# Patient Record
Sex: Female | Born: 1990 | Race: Black or African American | Hispanic: No | Marital: Single | Smoking: Never smoker
Health system: Southern US, Community
[De-identification: ages and names within clinical notes are randomized; demographics above are authoritative.]

---

## 2012-02-28 ENCOUNTER — Emergency Department: Payer: Self-pay | Admitting: Emergency Medicine

## 2012-02-28 LAB — CBC
HGB: 13.8 g/dL (ref 12.0–16.0)
MCH: 32.7 pg (ref 26.0–34.0)
MCHC: 35.1 g/dL (ref 32.0–36.0)
MCV: 93 fL (ref 80–100)
RBC: 4.22 10*6/uL (ref 3.80–5.20)
RDW: 13.6 % (ref 11.5–14.5)
WBC: 7 10*3/uL (ref 3.6–11.0)

## 2012-02-28 LAB — URINALYSIS, COMPLETE
Bacteria: NONE SEEN
Bilirubin,UR: NEGATIVE
Ph: 5 (ref 4.5–8.0)
Protein: 100
RBC,UR: 2039 /HPF (ref 0–5)
Specific Gravity: 1.024 (ref 1.003–1.030)
Squamous Epithelial: 1
WBC UR: 27 /HPF (ref 0–5)

## 2014-06-03 NOTE — Consult Note (Signed)
24 y/o BFto ED with heavy vaginal bleeding.to have approx19 8 week twin gestational sacs; first sac elongated and low, the second higher in the uterus with area of hemorrhage aboveamount of tissue passed spontaneously after U/S, in ED     NKDA  [facility]; last given at Thanksgiving (reports neg. preg test at that time)  Surg: none  OB: 854 y/o daughter  fam hx:  N/C  Soc Hx: denies tob,alcohol,stree drugs    All systems normal axcept for Vag Bleeding/cramps   AF   VSS  NAD  A&O x4  HEENT: neg  Lungs: CTA  Heart: RRR  Back: neg CVAT   Extrem: NT  Vulva: clot noted external vag  Spec: No active VB noted; Cvx closed to sponge forcep   U/S shows no apparent residual tissue; what appears to be clot is noted thruout. No echogenic material is seen Completed SAB of twin gestation  1 -- pelvic rest  2 -- F/U with me 2 weeks  3 -- If saturating pads for greater than 2 hrs in a row, return to ED  Electronic Signatures: Margaretha GlassingEvans, Ricky L (MD)  (Signed on 17-Jan-14 06:11)  Authored  Last Updated: 17-Jan-14 06:11 by Margaretha GlassingEvans, Ricky L (MD)

## 2016-07-10 ENCOUNTER — Emergency Department
Admission: EM | Admit: 2016-07-10 | Discharge: 2016-07-10 | Disposition: A | Discharge status: Home / Self Care | Payer: Self-pay | Attending: Emergency Medicine | Admitting: Emergency Medicine

## 2016-07-10 ENCOUNTER — Encounter: Payer: Self-pay | Admitting: Emergency Medicine

## 2016-07-10 DIAGNOSIS — N3000 Acute cystitis without hematuria: Secondary | ICD-10-CM | POA: Insufficient documentation

## 2016-07-10 LAB — POCT PREGNANCY, URINE: Preg Test, Ur: NEGATIVE

## 2016-07-10 LAB — URINALYSIS, COMPLETE (UACMP) WITH MICROSCOPIC
Bacteria, UA: NONE SEEN
Bilirubin Urine: NEGATIVE
Glucose, UA: NEGATIVE mg/dL
Hgb urine dipstick: NEGATIVE
KETONES UR: NEGATIVE mg/dL
Nitrite: NEGATIVE
PH: 6 (ref 5.0–8.0)
PROTEIN: NEGATIVE mg/dL
Specific Gravity, Urine: 1.019 (ref 1.005–1.030)

## 2016-07-10 MED ORDER — CIPROFLOXACIN HCL 500 MG PO TABS: 500.0000 mg | ORAL_TABLET | Freq: Two times a day (BID) | ORAL | 0 refills | Status: AC

## 2016-07-10 NOTE — ED Provider Notes (Signed)
[facility] Emergency Department Provider Note  ____________________________________________  Time seen: Approximately 9:50 AM  I have reviewed the triage vital signs and the nursing notes.   HISTORY  Chief Complaint Hematuria and Dysuria    HPI Stephanie Yoder is a 26 y.o. female who presents to the emergency department for evaluation of dysuria and hematuria. She denies frequent UTIs, fever, and back pain. She has not taken any medication for pain.  History reviewed. No pertinent past medical history.  There are no active problems to display for this patient.   History reviewed. No pertinent surgical history.  Prior to Admission medications   Medication Sig Start Date End Date Taking? Authorizing Provider  ciprofloxacin (CIPRO) 500 MG tablet Take 1 tablet (500 mg total) by mouth 2 (two) times daily. 07/10/16   Abrie Egloff, Rulon Eisenmenger B, FNP    Allergies Sulfa antibiotics  No family history on file.  Social History Social History  Substance Use Topics  . Smoking status: Never Smoker  . Smokeless tobacco: Never Used  . Alcohol use Yes     Comment: occasionally    Review of Systems Constitutional: Negative for fever. Respiratory: Negative for shortness of breath or cough. Gastrointestinal: Negative for abdominal pain; negative for nausea , negative for vomiting. Genitourinary: Positive for dysuria , negative for vaginal discharge. Musculoskeletal: Negative for back pain. Skin: Negative for rash, lesion, or wound. ____________________________________________   PHYSICAL EXAM:  VITAL SIGNS: ED Triage Vitals  Enc Vitals Group     BP 07/10/16 0921 (!) 128/93     Pulse Rate 07/10/16 0921 75     Resp 07/10/16 0921 18     Temp 07/10/16 0921 98.2 F (36.8 C)     Temp Source 07/10/16 0921 Oral     SpO2 07/10/16 0921 100 %     Weight 07/10/16 0921 152 lb (68.9 kg)     Height 07/10/16 0921 5\' 8"  (1.727 m)     Head Circumference --      Peak Flow --       Pain Score 07/10/16 0920 6     Pain Loc --      Pain Edu? --      Excl. in GC? --     Constitutional: Alert and oriented. Well appearing and in no acute distress. Eyes: Conjunctivae are normal. PERRL. EOMI. Head: Atraumatic. Nose: No congestion/rhinnorhea. Mouth/Throat: Mucous membranes are moist. Respiratory: Normal respiratory effort.  No retractions. Gastrointestinal: Soft and nontender without rebound or guarding. Genitourinary: Pelvic exam: not indicated. Musculoskeletal: No extremity tenderness nor edema.  Neurologic:  Normal speech and language. No gross focal neurologic deficits are appreciated. Speech is normal. No gait instability. Skin:  Skin is warm, dry and intact. No rash noted. Psychiatric: Mood and affect are normal. Speech and behavior are normal.  ____________________________________________   LABS (all labs ordered are listed, but only abnormal results are displayed)  Labs Reviewed  URINALYSIS, COMPLETE (UACMP) WITH MICROSCOPIC - Abnormal; Notable for the following:       Result Value   Color, Urine YELLOW (*)    APPearance HAZY (*)    Leukocytes, UA TRACE (*)    Squamous Epithelial / LPF 6-30 (*)    All other components within normal limits  POC URINE PREG, ED  POCT PREGNANCY, URINE   ____________________________________________  RADIOLOGY  Not indicated. ____________________________________________   PROCEDURES  Procedure(s) performed: None  ____________________________________________  26 year old female presenting to the emergency department for evaluation of dysuria and hematuria. Symptoms started yesterday.  Urinalysis shows white blood cells and symptoms, see epithelial cells, but no indication of hematuria. Due to symptoms and presents of white blood cells, she'll be treated with 3 days of ciprofloxacin. She was encouraged to go to the free clinic for any concern of STI. She was encouraged to return to the emergency department for  symptoms change or worsen if she is unable schedule an appointment.  INITIAL IMPRESSION / ASSESSMENT AND PLAN / ED COURSE  Pertinent labs & imaging results that were available during my care of the patient were reviewed by me and considered in my medical decision making (see chart for details).  ____________________________________________   FINAL CLINICAL IMPRESSION(S) / ED DIAGNOSES  Final diagnoses:  Acute cystitis without hematuria    Note:  This document was prepared using Dragon voice recognition software and may include unintentional dictation errors.    Chinita Pesterriplett, Kahiau Schewe B, FNP 07/10/16 1117    Minna AntisPaduchowski, Kevin, MD 07/10/16 1426

## 2016-07-10 NOTE — ED Notes (Signed)
See triage note  States she noticed some blood in urine yesterday   Also having some freq and dysuria no fever or n/v/d

## 2016-07-10 NOTE — Discharge Instructions (Signed)
Please follow up with the primary care provider of your choice for symptoms that are not improving with the medications. Return to the ER for symptoms that change or worsen if unable to schedule an appointment.

## 2016-07-10 NOTE — ED Triage Notes (Signed)
Patient presents to the ED with dysuria and hematuria.  Patient states, "I feel like I have a urinary tract infection."  Patient is tearful in triage discussing her lack of insurance.  This RN discussed the [facility] and [facility].
